# Patient Record
Sex: Female | Born: 2000 | Race: White | Hispanic: No | Marital: Single | Smoking: Never smoker
Health system: Southern US, Community
[De-identification: ages and names within clinical notes are randomized; demographics above are authoritative.]

## PROBLEM LIST (undated history)

## (undated) DIAGNOSIS — K589 Irritable bowel syndrome without diarrhea: Secondary | ICD-10-CM

## (undated) DIAGNOSIS — K760 Fatty (change of) liver, not elsewhere classified: Secondary | ICD-10-CM

## (undated) HISTORY — DX: Irritable bowel syndrome, unspecified: K58.9

## (undated) HISTORY — DX: Fatty (change of) liver, not elsewhere classified: K76.0

---
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
## 2021-12-11 HISTORY — PX: WISDOM TOOTH EXTRACTION: SHX21

## 2024-05-06 ENCOUNTER — Ambulatory Visit (INDEPENDENT_AMBULATORY_CARE_PROVIDER_SITE_OTHER): Admitting: Family Medicine

## 2024-05-06 ENCOUNTER — Encounter: Payer: Self-pay | Admitting: Family Medicine

## 2024-05-06 ENCOUNTER — Ambulatory Visit: Payer: Self-pay | Admitting: Family Medicine

## 2024-05-06 VITALS — BP 127/78 | HR 84 | Temp 97.5°F | Ht 64.0 in | Wt 260.4 lb

## 2024-05-06 DIAGNOSIS — Z131 Encounter for screening for diabetes mellitus: Secondary | ICD-10-CM | POA: Diagnosis not present

## 2024-05-06 DIAGNOSIS — R1012 Left upper quadrant pain: Secondary | ICD-10-CM | POA: Diagnosis not present

## 2024-05-06 DIAGNOSIS — R6889 Other general symptoms and signs: Secondary | ICD-10-CM | POA: Diagnosis not present

## 2024-05-06 DIAGNOSIS — R112 Nausea with vomiting, unspecified: Secondary | ICD-10-CM | POA: Diagnosis not present

## 2024-05-06 LAB — CBC WITH DIFFERENTIAL/PLATELET
Basophils Absolute: 0.1 10*3/uL (ref 0.0–0.1)
Basophils Relative: 0.6 % (ref 0.0–3.0)
Eosinophils Absolute: 0.1 10*3/uL (ref 0.0–0.7)
Eosinophils Relative: 1 % (ref 0.0–5.0)
HCT: 43.9 % (ref 36.0–46.0)
Hemoglobin: 15 g/dL (ref 12.0–15.0)
Lymphocytes Relative: 20.5 % (ref 12.0–46.0)
Lymphs Abs: 1.8 10*3/uL (ref 0.7–4.0)
MCHC: 34.2 g/dL (ref 30.0–36.0)
MCV: 85.5 fl (ref 78.0–100.0)
Monocytes Absolute: 0.5 10*3/uL (ref 0.1–1.0)
Monocytes Relative: 6.3 % (ref 3.0–12.0)
Neutro Abs: 6.1 10*3/uL (ref 1.4–7.7)
Neutrophils Relative %: 71.6 % (ref 43.0–77.0)
Platelets: 285 10*3/uL (ref 150.0–400.0)
RBC: 5.14 Mil/uL — ABNORMAL HIGH (ref 3.87–5.11)
RDW: 12.6 % (ref 11.5–15.5)
WBC: 8.5 10*3/uL (ref 4.0–10.5)

## 2024-05-06 LAB — COMPREHENSIVE METABOLIC PANEL WITH GFR
ALT: 16 U/L (ref 0–35)
AST: 16 U/L (ref 0–37)
Albumin: 4.6 g/dL (ref 3.5–5.2)
Alkaline Phosphatase: 58 U/L (ref 39–117)
BUN: 13 mg/dL (ref 6–23)
CO2: 25 meq/L (ref 19–32)
Calcium: 10.1 mg/dL (ref 8.4–10.5)
Chloride: 102 meq/L (ref 96–112)
Creatinine, Ser: 0.83 mg/dL (ref 0.40–1.20)
GFR: 99.9 mL/min (ref >60.00)
Glucose, Bld: 91 mg/dL (ref 70–99)
Potassium: 4.9 meq/L (ref 3.5–5.1)
Sodium: 136 meq/L (ref 135–145)
Total Bilirubin: 0.6 mg/dL (ref 0.2–1.2)
Total Protein: 7.6 g/dL (ref 6.0–8.3)

## 2024-05-06 LAB — HEMOGLOBIN A1C: Hgb A1c MFr Bld: 5.5 % (ref 4.6–6.5)

## 2024-05-06 LAB — TSH: TSH: 2.29 u[IU]/mL (ref 0.35–5.50)

## 2024-05-06 LAB — LIPASE: Lipase: 23 U/L (ref 11.0–59.0)

## 2024-05-06 NOTE — Progress Notes (Signed)
 Office Note 05/06/2024  CC:  Chief Complaint  Patient presents with   Establish Care    1 month ago, vomiting started and occurred for 1 month; was doing bland diet until 1-2 weeks ago, able to eat normal diet now. Was having LUQ/LLQ abd pain.     HPI:  Abigail Jones is a 23 y.o.  female who is here to establish care, discuss vomiting. Patient's most recent primary MD: None Old records were not available for review prior to or during today's visit.  About 5 weeks ago she had a day in which she had significant nausea and repetitive vomiting. This resolved and then 1 week later she had another day similar. After that it began to occur more frequently, approximately every few days and she would feel miserable during those days--mostly nauseated and occasionally some left lower quadrant sharp pains on and off.  She was unable to tolerate any kind of food other than chicken broth during these times. She would have "sulfur burps".  Denies any epigastric or right lower quadrant pain.  She did not have any fevers. She describes herself as having longstanding tendency towards postprandial urgent diarrhea but says this has gotten some better since improving the health of her diet.  Interestingly, she states that for approximately the last 1 to 2 weeks all of her symptoms have gone away.  She describes recurrent/intermittent red rash under the adipose folds of her breast and abdomen. Worse with sweating. It does not itch or hurt currently.  She does not take any over-the-counter medicines. She does use some "legal cannabis" product that she gets at a tobacco/vape shop. She states that she uses this when she gets nausea and it helps. States she is sexually active only with females. Dates menses occur at regular intervals and flow is normal.  History reviewed. No pertinent past medical history.  Past Surgical History:  Procedure Laterality Date   WISDOM TOOTH EXTRACTION  2023    Family  History  Problem Relation Age of Onset   Healthy Mother    Early death Father    Lung cancer Father    Hearing loss Father    Breast cancer Maternal Grandmother     Social History   Socioeconomic History   Marital status: Single    Spouse name: Not on file   Number of children: Not on file   Years of education: Not on file   Highest education level: Not on file  Occupational History   Not on file  Tobacco Use   Smoking status: Never    Passive exposure: Yes   Smokeless tobacco: Never  Vaping Use   Vaping status: Never Used  Substance and Sexual Activity   Alcohol use: Yes    Comment: social   Drug use: Yes    Types: Marijuana   Sexual activity: Yes    Partners: Female    Birth control/protection: None  Other Topics Concern   Not on file  Social History Narrative   Single.   Lives in [place] .  Attended [employer].   Attended [employer], associate's degree in science.      Social Drivers of Corporate investment banker Strain: Not on file  Food Insecurity: Not on file  Transportation Needs: Not on file  Physical Activity: Not on file  Stress: Not on file  Social Connections: Not on file  Intimate Partner Violence: Not on file    No outpatient encounter medications on file as of  05/06/2024.   No facility-administered encounter medications on file as of 05/06/2024.    Allergies  Allergen Reactions   Amoxicillin Rash    Review of Systems  Constitutional:  Negative for appetite change, chills, fatigue and fever.  HENT:  Negative for congestion, dental problem, ear pain and sore throat.   Eyes:  Negative for discharge, redness and visual disturbance.  Respiratory:  Negative for cough, chest tightness, shortness of breath and wheezing.   Cardiovascular:  Negative for chest pain, palpitations and leg swelling.  Gastrointestinal:  Positive for abdominal pain, nausea and vomiting. Negative for blood in stool and diarrhea.  Genitourinary:   Negative for difficulty urinating, dysuria, flank pain, frequency, hematuria and urgency.  Musculoskeletal:  Negative for arthralgias, back pain, joint swelling, myalgias and neck stiffness.  Skin:  Negative for pallor and rash.  Neurological:  Negative for dizziness, speech difficulty, weakness and headaches.  Hematological:  Negative for adenopathy. Does not bruise/bleed easily.  Psychiatric/Behavioral:  Negative for confusion and sleep disturbance. The patient is not nervous/anxious.     PE; Blood pressure 127/78, pulse 84, temperature (!) 97.5 F (36.4 C), temperature source Oral, height 5\' 4"  (1.626 m), weight 260 lb 6.4 oz (118.1 kg), SpO2 96%.  Physical Exam Body mass index is 44.7 kg/m.  Exam chaperoned by Terris Fickle, CMA. Gen: Alert, well appearing.  Patient is oriented to person, place, time, and situation. AFFECT: pleasant, lucid thought and speech. QMV:HQIO: no injection, icteris, swelling, or exudate.  EOMI, PERRLA. Mouth: lips without lesion/swelling.  Oral mucosa pink and moist. Oropharynx without erythema, exudate, or swelling.  CV: RRR, no m/r/g.   LUNGS: CTA bilat, nonlabored resps, good aeration in all lung fields. ABD: soft, NT, ND, BS normal.  No hepatospenomegaly or mass.  No bruits. EXT: no clubbing or cyanosis.  no edema.  Skin: No rash, pallor, or jaundice.  Pertinent labs:  none  ASSESSMENT AND PLAN:   New patient, establishing care.  1.  Nausea and vomiting. Symptoms have abated/resolved in the last 1 to 2 weeks. Still question whether or not she is having some symptomatic cholelithiasis. Also could be dyspepsia/gastritis. Check CBC, c-Met, lipase. Check right upper quadrant limited abdominal ultrasound.  #2 abnormal weight gain. Check TSH as well as nonfasting glucose and hemoglobin A1c.  An After Visit Summary was printed and given to the patient.  Return in about 4 weeks (around 06/03/2024) for nausea/vomiting.  Signed:  Arletha Lady,  MD           05/06/2024

## 2024-05-06 NOTE — Patient Instructions (Signed)
   It was nice to meet you  PLEASE NOTE:  If labs were collected or images ordered, we will inform you of  results once we have received them and reviewed. We will contact you either by echart message, or telephone call.     If we ordered any referrals today, please let us  know if you have not heard from their office within the next 2 weeks. You should receive a letter via MyChart confirming if the referral was approved and their office contact information to schedule.

## 2024-05-22 ENCOUNTER — Ambulatory Visit: Admitting: Family Medicine

## 2024-05-22 ENCOUNTER — Encounter: Payer: Self-pay | Admitting: Family Medicine

## 2024-05-22 ENCOUNTER — Ambulatory Visit (HOSPITAL_COMMUNITY)
Admission: RE | Admit: 2024-05-22 | Discharge: 2024-05-22 | Disposition: A | Discharge status: Home / Self Care | Source: Ambulatory Visit | Attending: Family Medicine | Admitting: Family Medicine

## 2024-05-22 DIAGNOSIS — R112 Nausea with vomiting, unspecified: Secondary | ICD-10-CM | POA: Insufficient documentation

## 2024-05-22 DIAGNOSIS — K760 Fatty (change of) liver, not elsewhere classified: Secondary | ICD-10-CM | POA: Diagnosis not present

## 2024-06-04 ENCOUNTER — Ambulatory Visit: Admitting: Family Medicine

## 2024-06-06 ENCOUNTER — Ambulatory Visit (INDEPENDENT_AMBULATORY_CARE_PROVIDER_SITE_OTHER): Admitting: Family Medicine

## 2024-06-06 ENCOUNTER — Encounter: Payer: Self-pay | Admitting: Family Medicine

## 2024-06-06 VITALS — BP 101/70 | HR 84 | Temp 98.2°F | Ht 64.0 in | Wt 260.2 lb

## 2024-06-06 DIAGNOSIS — Z8719 Personal history of other diseases of the digestive system: Secondary | ICD-10-CM | POA: Diagnosis not present

## 2024-06-06 DIAGNOSIS — R112 Nausea with vomiting, unspecified: Secondary | ICD-10-CM

## 2024-06-06 NOTE — Progress Notes (Signed)
 OFFICE VISIT  06/06/2024  CC:  Chief Complaint  Patient presents with   Follow-up    4 week f/u nausea; has not had any recent episodes of nausea since 1 week before last OV    Patient is a 23 y.o. female who presents for 1 month follow-up of nausea and vomiting. A/P as of last visit: 1.  Nausea and vomiting. Symptoms have abated/resolved in the last 1 to 2 weeks. Still question whether or not she is having some symptomatic cholelithiasis. Also could be dyspepsia/gastritis. Check CBC, c-Met, lipase. Check right upper quadrant limited abdominal ultrasound.   #2 abnormal weight gain. Check TSH as well as nonfasting glucose and hemoglobin A1c.  INTERIM HX: All lab work last visit was normal. Abdominal ultrasound showed fatty liver, otherwise normal.  She has not had any further nausea or vomiting.  No abdominal pain. No diarrhea or constipation. She is currently taking no medications.   Past Medical History:  Diagnosis Date   Hepatic steatosis    IBS (irritable bowel syndrome)     Past Surgical History:  Procedure Laterality Date   WISDOM TOOTH EXTRACTION  2023    No outpatient medications prior to visit.   No facility-administered medications prior to visit.    Allergies  Allergen Reactions   Amoxicillin Rash    Review of Systems As per HPI  PE:    06/06/2024   10:42 AM 05/06/2024   11:01 AM 02/15/2018   12:24 PM  Vitals with BMI  Height 5' 4 5' 4 5' 2  Weight 260 lbs 3 oz 260 lbs 6 oz 255 lbs  BMI 44.64 44.68 46.63  Systolic 101 127 879  Diastolic 70 78 75  Pulse 84 84 56     Physical Exam  Gen: Alert, well appearing.  Patient is oriented to person, place, time, and situation. AFFECT: pleasant, lucid thought and speech. No further exam today  LABS:  Last CBC Lab Results  Component Value Date   WBC 8.5 05/06/2024   HGB 15.0 05/06/2024   HCT 43.9 05/06/2024   MCV 85.5 05/06/2024   RDW 12.6 05/06/2024   PLT 285.0 05/06/2024   Last  metabolic panel Lab Results  Component Value Date   GLUCOSE 91 05/06/2024   NA 136 05/06/2024   K 4.9 05/06/2024   CL 102 05/06/2024   CO2 25 05/06/2024   BUN 13 05/06/2024   CREATININE 0.83 05/06/2024   GFR 99.90 05/06/2024   CALCIUM 10.1 05/06/2024   PROT 7.6 05/06/2024   ALBUMIN 4.6 05/06/2024   BILITOT 0.6 05/06/2024   ALKPHOS 58 05/06/2024   AST 16 05/06/2024   ALT 16 05/06/2024   Last hemoglobin A1c Lab Results  Component Value Date   HGBA1C 5.5 05/06/2024   Last thyroid functions Lab Results  Component Value Date   TSH 2.29 05/06/2024   Lab Results  Component Value Date   LIPASE 23.0 05/06/2024   IMPRESSION AND PLAN:  Nausea and vomiting. Symptoms have abated/resolved completely. General lab panel was normal and right upper quadrant limited ultrasound showed no abnormality other than fatty liver. I suspect her symptoms were severe episodes of GERD/dyspepsia. No new workup or treatment today. Signs/symptoms to call or return for were reviewed and pt expressed understanding.  An After Visit Summary was printed and given to the patient.  FOLLOW UP: Return for as needed.  Signed:  Gerlene Hockey, MD           06/06/2024

## 2024-12-16 ENCOUNTER — Encounter: Payer: Self-pay | Admitting: Family Medicine

## 2024-12-16 DIAGNOSIS — E66813 Obesity, class 3: Secondary | ICD-10-CM

## 2024-12-16 NOTE — Telephone Encounter (Signed)
 Okay, referral order is in

## 2024-12-16 NOTE — Telephone Encounter (Signed)
 Pt's last OV was 6/27. Referral pending.   Please sign off if appropriate

## 2024-12-18 ENCOUNTER — Encounter: Payer: Self-pay | Admitting: Bariatrics

## 2024-12-18 ENCOUNTER — Ambulatory Visit (INDEPENDENT_AMBULATORY_CARE_PROVIDER_SITE_OTHER): Admitting: Bariatrics

## 2024-12-18 VITALS — BP 119/81 | HR 76 | Temp 98.1°F | Ht 63.5 in | Wt 250.0 lb

## 2024-12-18 DIAGNOSIS — K760 Fatty (change of) liver, not elsewhere classified: Secondary | ICD-10-CM

## 2024-12-18 DIAGNOSIS — K580 Irritable bowel syndrome with diarrhea: Secondary | ICD-10-CM

## 2024-12-18 DIAGNOSIS — Z6841 Body Mass Index (BMI) 40.0 and over, adult: Secondary | ICD-10-CM | POA: Diagnosis not present

## 2024-12-18 NOTE — Progress Notes (Signed)
 " Office: [phone]  /  Fax: [phone]   Iinformation session:   Abigail Jones was seen in clinic today to evaluate for obesity. She is interested in losing weight to improve overall health and reduce the risk of weight related complications. She presents today to review program treatment options, initial physical assessment, and evaluation.     She was referred by: Friend or Family  When asked what else they would like to accomplish? She states: Adopt a healthier eating pattern and lifestyle, Improve energy levels and physical activity, Improve existing medical conditions, and Improve quality of life  When asked how has your weight affected you? She states: Having fatigue and Having poor endurance  Some associated conditions: Other: IBS-diarrhea, and family history of Dm type 2.   Contributing factors: family history of obesity, moderate to high levels of stress, and chronic skipping of meals  Weight promoting medications identified: None  Current nutrition plan: High-protein and Portion control / smart choices  Current level of physical activity: None and Other: going to the gym.   Current or previous pharmacotherapy: None  Response to medication: Never tried medications   Past medical history includes:   Past Medical History:  Diagnosis Date   Hepatic steatosis    IBS (irritable bowel syndrome)      Objective:   BP 119/81   Pulse 76   Temp 98.1 F (36.7 C)   Ht 5' 3.5 (1.613 m)   Wt 250 lb (113.4 kg)   SpO2 98%   BMI 43.59 kg/m  She was weighed on the bioimpedance scale: Body mass index is 43.59 kg/m.  Peak Weight:263 lbs , Body Fat%:48.2 %, Visceral Fat Rating:12, Weight trend over the last 12 months: Increasing  General:  Alert, oriented and cooperative. Patient is in no acute distress.  Respiratory: Normal respiratory effort, no problems with respiration noted  Extremities: Normal range of motion.    Mental Status: Normal mood and affect. Normal  behavior. Normal judgment and thought content.   DIAGNOSTIC DATA REVIEWED:  BMET    Component Value Date/Time   NA 136 05/06/2024 1154   K 4.9 05/06/2024 1154   CL 102 05/06/2024 1154   CO2 25 05/06/2024 1154   GLUCOSE 91 05/06/2024 1154   BUN 13 05/06/2024 1154   CREATININE 0.83 05/06/2024 1154   CALCIUM 10.1 05/06/2024 1154   Lab Results  Component Value Date   HGBA1C 5.5 05/06/2024   No results found for: INSULIN CBC    Component Value Date/Time   WBC 8.5 05/06/2024 1154   RBC 5.14 (H) 05/06/2024 1154   HGB 15.0 05/06/2024 1154   HCT 43.9 05/06/2024 1154   PLT 285.0 05/06/2024 1154   MCV 85.5 05/06/2024 1154   MCHC 34.2 05/06/2024 1154   RDW 12.6 05/06/2024 1154   Iron/TIBC/Ferritin/ %Sat No results found for: IRON, TIBC, FERRITIN, IRONPCTSAT Lipid Panel  No results found for: CHOL, TRIG, HDL, CHOLHDL, VLDL, LDLCALC, LDLDIRECT Hepatic Function Panel     Component Value Date/Time   PROT 7.6 05/06/2024 1154   ALBUMIN 4.6 05/06/2024 1154   AST 16 05/06/2024 1154   ALT 16 05/06/2024 1154   ALKPHOS 58 05/06/2024 1154   BILITOT 0.6 05/06/2024 1154      Component Value Date/Time   TSH 2.29 05/06/2024 1154     Assessment and Plan:   Hepatic steatosis:   She had a ultrasound of her liver/abdomen on 05/22/2024 which showed hepatic steatosis.   Plan:  Will join our program and  will schedule her initial visit. Will work on weight loss and exercise.   IBS-diarrhea:   History of irritable bowel syndrome per patient with diarrhea prominent.  She denies the use of any medication.  She continues to have episodes of diarrhea intermittently.  Plan:  She will follow-up with her PCP/GI if needed. She will begin our program. She will avoid foods that will aggravate her signs and symptoms.    Morbid Obesity: Current BMI 43.59    Obesity Treatment / Action Plan:  Patient will work on garnering support from family and friends to begin  weight loss journey. Will work on eliminating or reducing the presence of highly palatable, calorie dense foods in the home. Will complete provided nutritional and psychosocial assessment questionnaire before the next appointment. Will be scheduled for indirect calorimetry to determine resting energy expenditure in a fasting state.  This will allow us  to create a reduced calorie, high-protein meal plan to promote loss of fat mass while preserving muscle mass. Counseled on the health benefits of losing 5%-15% of total body weight. Was counseled on nutritional approaches to weight loss and benefits of reducing processed foods and consuming plant-based foods and high quality protein as part of nutritional weight management. Was counseled on pharmacotherapy and role as an adjunct in weight management.   Obesity Education Performed Today:  She was weighed on the bioimpedance scale and results were discussed and documented in the synopsis.  We discussed obesity as a disease and the importance of a more detailed evaluation of all the factors contributing to the disease.  We discussed the importance of long term lifestyle changes which include nutrition, exercise and behavioral modifications as well as the importance of customizing this to her specific health and social needs.  We discussed the benefits of reaching a healthier weight to alleviate the symptoms of existing conditions and reduce the risks of the biomechanical, metabolic and psychological effects of obesity.  Discussed New Patient/Late Arrival, and Cancellation Policies. Patient voiced understanding and allowed to ask questions.   Abigail Jones appears to be in the action stage of change and states they are ready to start intensive lifestyle modifications and behavioral modifications.  30 minutes was spent today on this visit including the above counseling, pre-visit chart review, and post-visit documentation.  Reviewed by clinician on  day of visit: allergies, medications, problem list, medical history, surgical history, family history, social history, and previous encounter notes.    Ezra Marquess A. Delores D.O.    "

## 2025-01-13 ENCOUNTER — Ambulatory Visit: Admitting: Bariatrics

## 2025-02-05 ENCOUNTER — Ambulatory Visit: Admitting: Bariatrics

## 2025-02-23 ENCOUNTER — Ambulatory Visit: Admitting: Bariatrics
